# Patient Record
Sex: Female | Born: 1995 | Race: White | Hispanic: No | Marital: Single | Smoking: Never smoker
Health system: Southern US, Community
[De-identification: ages and names within clinical notes are randomized; demographics above are authoritative.]

## PROBLEM LIST (undated history)

## (undated) DIAGNOSIS — J45909 Unspecified asthma, uncomplicated: Secondary | ICD-10-CM

## (undated) DIAGNOSIS — G43909 Migraine, unspecified, not intractable, without status migrainosus: Secondary | ICD-10-CM

## (undated) HISTORY — PX: KIDNEY SURGERY: SHX687

## (undated) HISTORY — PX: LITHOTRIPSY: SUR834

## (undated) HISTORY — PX: TONSILLECTOMY: SUR1361

---
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
## 2017-06-04 ENCOUNTER — Ambulatory Visit
Admission: EM | Admit: 2017-06-04 | Discharge: 2017-06-04 | Disposition: A | Discharge status: Home / Self Care | Payer: Commercial Managed Care - PPO | Attending: Family Medicine | Admitting: Family Medicine

## 2017-06-04 ENCOUNTER — Other Ambulatory Visit: Payer: Self-pay

## 2017-06-04 ENCOUNTER — Encounter: Payer: Self-pay | Admitting: Emergency Medicine

## 2017-06-04 DIAGNOSIS — R221 Localized swelling, mass and lump, neck: Secondary | ICD-10-CM

## 2017-06-04 HISTORY — DX: Migraine, unspecified, not intractable, without status migrainosus: G43.909

## 2017-06-04 NOTE — ED Triage Notes (Signed)
Patient in today c/o right side of neck swelling x 2 months. Patient denies any difficulty swallowing, thyroid problems or sore throat. No fever.

## 2017-06-04 NOTE — Discharge Instructions (Signed)
Follow up with Primary Care Provider for further evaluation with ultrasound

## 2017-06-04 NOTE — ED Provider Notes (Signed)
[facility]    CSN: [id number] Arrival date & time: 06/04/17  1656     History   Chief Complaint Chief Complaint  Patient presents with  . Facial Swelling    neck    HPI Mary Robertson is a 22 y.o. female.   22 yo female with a c/o right sided neck swelling for 2 months. States she happened to notice one day that her right side of the neck appeared larger than the left. Denies any pain, injuries, difficulty swallowing, difficulty breathing, fevers, chills, ear pain, skin rash, or limitation of movement.    The history is provided by the patient.    Past Medical History:  Diagnosis Date  . Migraine     There are no active problems to display for this patient.   Past Surgical History:  Procedure Laterality Date  . KIDNEY SURGERY    . LITHOTRIPSY    . TONSILLECTOMY      OB History    Gravida Para Term Preterm AB Living   0 0 0 0 0 0   SAB TAB Ectopic Multiple Live Births   0 0 0 0         Home Medications    Prior to Admission medications   Medication Sig Start Date End Date Taking? Authorizing Provider  SUMAtriptan (IMITREX) 50 MG tablet Take 1 tablet (50 mg total) by mouth once as needed for migraine. May repeat in 2 hours if headache persists or recurs. 06/14/15 06/04/17 Yes Emily Filbert, MD    Family History Family History  Problem Relation Age of Onset  . Valvular heart disease Mother   . Arrhythmia Mother   . Asthma Mother   . Hyperlipidemia Mother   . Hypertension Father   . Hyperlipidemia Father   . Diabetes Father   . Heart disease Father     Social History Social History   Tobacco Use  . Smoking status: Never Smoker  . Smokeless tobacco: Never Used  Substance Use Topics  . Alcohol use: No  . Drug use: No     Allergies   Septra [sulfamethoxazole-trimethoprim]   Review of Systems Review of Systems   Physical Exam Triage Vital Signs ED Triage Vitals [06/04/17 1708]  Enc Vitals Group     BP 137/65    Pulse Rate 87     Resp 16     Temp 98.5 F (36.9 C)     Temp Source Oral     SpO2 100 %     Weight 210 lb (95.3 kg)     Height 5\' 2"  (1.575 m)     Head Circumference      Peak Flow      Pain Score 0     Pain Loc      Pain Edu?      Excl. in GC?    No data found.  Updated Vital Signs BP 137/65 (BP Location: Left Arm)   Pulse 87   Temp 98.5 F (36.9 C) (Oral)   Resp 16   Ht 5\' 2"  (1.575 m)   Wt 210 lb (95.3 kg)   LMP 05/12/2017 (Approximate)   SpO2 100%   BMI 38.41 kg/m   Visual Acuity Right Eye Distance:   Left Eye Distance:   Bilateral Distance:    Right Eye Near:   Left Eye Near:    Bilateral Near:     Physical Exam  Constitutional: She appears well-developed and well-nourished. No distress.  HENT:  Head: Normocephalic and atraumatic.  Right Ear: External ear normal.  Left Ear: External ear normal.  Nose: Nose normal.  Mouth/Throat: Oropharynx is clear and moist. No oropharyngeal exudate.  Neck: Normal range of motion. Neck supple.    Right anterolateral subcutaneous soft tissue enlargement (compared to left); non-tender; no skin erythema or skin lesions  Lymphadenopathy:    She has no cervical adenopathy.  Skin: She is not diaphoretic.  Nursing note and vitals reviewed.    UC Treatments / Results  Labs (all labs ordered are listed, but only abnormal results are displayed) Labs Reviewed - No data to display  EKG  EKG Interpretation None       Radiology No results found.  Procedures Procedures (including critical care time)  Medications Ordered in UC Medications - No data to display   Initial Impression / Assessment and Plan / UC Course  I have reviewed the triage vital signs and the nursing notes.  Pertinent labs & imaging results that were available during my care of the patient were reviewed by me and considered in my medical decision making (see chart for details).       Final Clinical Impressions(s) / UC Diagnoses    Final diagnoses:  Localized swelling, mass and lump, neck    ED Discharge Orders    None     1.discussed with patient and mother possible etiologies 2. Recommend follow up with PCP for further evaluation referral for possible ultrasound and/or specialist   Controlled Substance Prescriptions [place] Controlled Substance Registry consulted? Not Applicable   Payton Mccallumonty, Ibraham Levi, MD 06/04/17 [phone]

## 2017-06-07 ENCOUNTER — Telehealth: Payer: Self-pay

## 2017-06-07 NOTE — Telephone Encounter (Signed)
[facility] SPoke with pt. Mother, reports that she followed up with primary.  Patient instructed to call back with any questions or concerns. [facility]

## 2017-07-21 ENCOUNTER — Other Ambulatory Visit: Payer: Self-pay | Admitting: Otolaryngology

## 2017-07-21 DIAGNOSIS — R221 Localized swelling, mass and lump, neck: Secondary | ICD-10-CM

## 2017-07-30 ENCOUNTER — Ambulatory Visit
Admission: RE | Admit: 2017-07-30 | Discharge: 2017-07-30 | Disposition: A | Discharge status: Home / Self Care | Payer: Commercial Managed Care - PPO | Source: Ambulatory Visit | Attending: Otolaryngology | Admitting: Otolaryngology

## 2017-07-30 DIAGNOSIS — R221 Localized swelling, mass and lump, neck: Secondary | ICD-10-CM

## 2017-07-30 HISTORY — DX: Unspecified asthma, uncomplicated: J45.909

## 2017-07-30 MED ORDER — IOPAMIDOL (ISOVUE-300) INJECTION 61%
75.0000 mL | Freq: Once | INTRAVENOUS | Status: AC | PRN
  Administered 2017-07-30: 75 mL via INTRAVENOUS

## 2018-03-13 ENCOUNTER — Encounter: Payer: Self-pay | Admitting: Emergency Medicine

## 2018-03-13 ENCOUNTER — Ambulatory Visit
Admission: EM | Admit: 2018-03-13 | Discharge: 2018-03-13 | Disposition: A | Discharge status: Home / Self Care | Payer: Commercial Managed Care - PPO | Attending: Emergency Medicine | Admitting: Emergency Medicine

## 2018-03-13 ENCOUNTER — Other Ambulatory Visit: Payer: Self-pay

## 2018-03-13 DIAGNOSIS — J069 Acute upper respiratory infection, unspecified: Secondary | ICD-10-CM

## 2018-03-13 DIAGNOSIS — J0100 Acute maxillary sinusitis, unspecified: Secondary | ICD-10-CM

## 2018-03-13 MED ORDER — ALBUTEROL SULFATE HFA 108 (90 BASE) MCG/ACT IN AERS: 1.0000 | INHALATION_SPRAY | Freq: Four times a day (QID) | RESPIRATORY_TRACT | 0 refills | Status: DC | PRN

## 2018-03-13 MED ORDER — FLUTICASONE PROPIONATE 50 MCG/ACT NA SUSP: 2.0000 | Freq: Every day | NASAL | 0 refills | Status: DC

## 2018-03-13 MED ORDER — AEROCHAMBER PLUS MISC: 2 refills | Status: DC

## 2018-03-13 MED ORDER — IBUPROFEN 600 MG PO TABS: 600.0000 mg | ORAL_TABLET | Freq: Four times a day (QID) | ORAL | 0 refills | Status: DC | PRN

## 2018-03-13 MED ORDER — AMOXICILLIN-POT CLAVULANATE 875-125 MG PO TABS: 1.0000 | ORAL_TABLET | Freq: Two times a day (BID) | ORAL | 0 refills | Status: DC

## 2018-03-13 MED ORDER — BENZONATATE 200 MG PO CAPS: 200.0000 mg | ORAL_CAPSULE | Freq: Three times a day (TID) | ORAL | 0 refills | Status: DC | PRN

## 2018-03-13 MED ORDER — HYDROCOD POLST-CPM POLST ER 10-8 MG/5ML PO SUER: 5.0000 mL | Freq: Two times a day (BID) | ORAL | 0 refills | Status: DC | PRN

## 2018-03-13 NOTE — ED Provider Notes (Signed)
HPI  SUBJECTIVE:  Mary Robertson is a 22 y.o. female who presents with 6 days of yellow nasal congestion, rhinorrhea, postnasal drip, cough productive of yellowish sputum, shortness of breath, dyspnea on exertion.  She reports upper dental pain but states that this has resolved.  She reports left ear pain.  She reports fevers T-max 100.  No facial swelling, wheezing, chest pain.  No allergy symptoms.  She is sleeping okay at night.  No antibiotics in the past month.  No antipyretic in the past 4 to 6 hours. She tried a leftover single dose of amoxicillin.  She has been alternating Tylenol 500 to 1,000 mg with ibuprofen 400 to 600 mg each day.  She has also tried NyQuil, Alka-Seltzer.  No alleviating factors.  Her symptoms are worse with bending forward.  She has a past medical history of asthma and is not using her inhaler.  No history of allergies, diabetes, hypertension.  LMP: End of October.  PMD: [facility]    Past Medical History:  Diagnosis Date  . Asthma   . Migraine     Past Surgical History:  Procedure Laterality Date  . KIDNEY SURGERY    . LITHOTRIPSY    . TONSILLECTOMY      Family History  Problem Relation Age of Onset  . Valvular heart disease Mother   . Arrhythmia Mother   . Asthma Mother   . Hyperlipidemia Mother   . Hypertension Father   . Hyperlipidemia Father   . Diabetes Father   . Heart disease Father     Social History   Tobacco Use  . Smoking status: Never Smoker  . Smokeless tobacco: Never Used  Substance Use Topics  . Alcohol use: No  . Drug use: No    No current facility-administered medications for this encounter.   Current Outpatient Medications:  .  albuterol (PROVENTIL HFA;VENTOLIN HFA) 108 (90 Base) MCG/ACT inhaler, Inhale 1-2 puffs into the lungs every 6 (six) hours as needed for wheezing or shortness of breath., Disp: 1 Inhaler, Rfl: 0 .  amoxicillin-clavulanate (AUGMENTIN) 875-125 MG tablet, Take 1 tablet by mouth 2 (two) times  daily. X 7 days, Disp: 14 tablet, Rfl: 0 .  benzonatate (TESSALON) 200 MG capsule, Take 1 capsule (200 mg total) by mouth 3 (three) times daily as needed for cough., Disp: 30 capsule, Rfl: 0 .  chlorpheniramine-HYDROcodone (TUSSIONEX PENNKINETIC ER) 10-8 MG/5ML SUER, Take 5 mLs by mouth every 12 (twelve) hours as needed for cough., Disp: 60 mL, Rfl: 0 .  fluticasone (FLONASE) 50 MCG/ACT nasal spray, Place 2 sprays into both nostrils daily., Disp: 16 g, Rfl: 0 .  ibuprofen (ADVIL,MOTRIN) 600 MG tablet, Take 1 tablet (600 mg total) by mouth every 6 (six) hours as needed., Disp: 30 tablet, Rfl: 0 .  Spacer/Aero-Holding Chambers (AEROCHAMBER PLUS) inhaler, Use as instructed, Disp: 1 each, Rfl: 2  Allergies  Allergen Reactions  . Septra [Sulfamethoxazole-Trimethoprim] Rash     ROS  As noted in HPI.   Physical Exam  BP 136/73 (BP Location: Left Arm)   Pulse 91   Temp 97.7 F (36.5 C) (Oral)   Resp 14   Ht 5\' 2"  (1.575 m)   Wt 88.5 kg   LMP 02/27/2018 (Approximate)   SpO2 100%   BMI 35.67 kg/m   Constitutional: Well developed, well nourished, no acute distress Eyes:  EOMI, conjunctiva normal bilaterally HENT: Normocephalic, atraumatic,mucus membranes moist.  TMs normal bilaterally.  Positive maxillary sinus tenderness.  No frontal sinus  tenderness.  Normal turbinates.  Positive mucoid nasal congestion.  Positive cobblestoning and postnasal drip. Respiratory: Normal inspiratory effort good air movement, lungs clear bilaterally. Cardiovascular: Normal rate GI: nondistended skin: No rash, skin intact Musculoskeletal: no deformities Neurologic: Alert & oriented x 3, no focal neuro deficits Psychiatric: Speech and behavior appropriate   ED Course   Medications - No data to display  No orders of the defined types were placed in this encounter.   No results found for this or any previous visit (from the past 24 hour(s)). No results found.  ED Clinical Impression  Viral upper  respiratory tract infection  Acute non-recurrent maxillary sinusitis   ED Assessment/Plan  [place] Narcotic database reviewed for this patient, and feel that the risk/benefit ratio today is favorable for proceeding with a prescription for controlled substance.  No narcotic prescriptions in the past 2 years.  Patient with a URI/sinusitis and possibly an asthma exacerbation.  Doubt pneumonia.  Normal vital signs, 100% on room air, no antipyretic in the past 4 to 6 hours.  Will start Mucinex D, saline nasal irrigation with a Lloyd Huger med rinse and distilled water as often as she wants, Flonase, 2 puffs from an albuterol inhaler using a spacer every 4-6 hours.  She may back off on this as she starts to feel better.  Ibuprofen 600 mg to take with 1 g of Tylenol together 3 or 4 times a day.  Tessalon for the cough during the day, Tussionex for the cough at night.  Wait-and-see prescription of Augmentin.  Advised her to try these medicines first for several days and if not, then go ahead and fill the Augmentin.  Follow-up with PMD in several days as needed.  Discussed MDM, treatment plan, and plan for follow-up with patient and parent.  They agree with plan .  Meds ordered this encounter  Medications  . fluticasone (FLONASE) 50 MCG/ACT nasal spray    Sig: Place 2 sprays into both nostrils daily.    Dispense:  16 g    Refill:  0  . ibuprofen (ADVIL,MOTRIN) 600 MG tablet    Sig: Take 1 tablet (600 mg total) by mouth every 6 (six) hours as needed.    Dispense:  30 tablet    Refill:  0  . amoxicillin-clavulanate (AUGMENTIN) 875-125 MG tablet    Sig: Take 1 tablet by mouth 2 (two) times daily. X 7 days    Dispense:  14 tablet    Refill:  0  . chlorpheniramine-HYDROcodone (TUSSIONEX PENNKINETIC ER) 10-8 MG/5ML SUER    Sig: Take 5 mLs by mouth every 12 (twelve) hours as needed for cough.    Dispense:  60 mL    Refill:  0  . benzonatate (TESSALON) 200 MG capsule    Sig: Take 1 capsule (200 mg total) by mouth  3 (three) times daily as needed for cough.    Dispense:  30 capsule    Refill:  0  . Spacer/Aero-Holding Chambers (AEROCHAMBER PLUS) inhaler    Sig: Use as instructed    Dispense:  1 each    Refill:  2  . albuterol (PROVENTIL HFA;VENTOLIN HFA) 108 (90 Base) MCG/ACT inhaler    Sig: Inhale 1-2 puffs into the lungs every 6 (six) hours as needed for wheezing or shortness of breath.    Dispense:  1 Inhaler    Refill:  0    *This clinic note was created using Scientist, clinical (histocompatibility and immunogenetics). Therefore, there may be occasional mistakes despite careful proofreading.   ?  Domenick Gong, MD 03/13/18 2004

## 2018-03-13 NOTE — ED Triage Notes (Signed)
Patient c/o cough and chest congestion since Saturday.  Patient reports low grade fevers.

## 2018-03-13 NOTE — Discharge Instructions (Addendum)
Discontinue all other cold medicines.  Start maximum strength Mucinex D twice a day and drink plenty of extra water, saline nasal irrigation with a Lloyd Huger med rinse and distilled water as often as you want, Flonase, 2 puffs from your albuterol inhaler using a spacer every 4-6 hours.   back off on this as you start to feel better.  Ibuprofen 600 mg to take with 1 g of Tylenol together 3 or 4 times a day.  Tessalon for the cough during the day, Tussionex for the cough at night.  Wait-and-see prescription of Augmentin.  try these other medicines first for day or 2 and if you are not getting better, go ahead and fill the Augmentin.  Follow-up with your primary care physician in several days as needed.

## 2018-04-19 IMAGING — CT CT NECK W/ CM
5 of 6 series · 14 of 35 positions shown, 16 images · IV contrast (iopamidol)
Comparison: Cervical spine radiographs April 05, 2008

CLINICAL DATA: Nonpainful RIGHT neck mass since March 2017.
History of tonsillectomy.

EXAM:
CT NECK WITH CONTRAST
TECHNIQUE: Multidetector CT imaging of the neck was performed using the
standard protocol following the bolus administration of intravenous
contrast.
CONTRAST:  75mL [id number] IOPAMIDOL (ZRX44S-6HH) INJECTION 61%

[Series 2: axial neck neck (person_name) · axial · 0.59mm/px · z∈[-717,-627]mm · 2 of 137 slices shown, 3 images]
[im 46/137  soft-tissue]
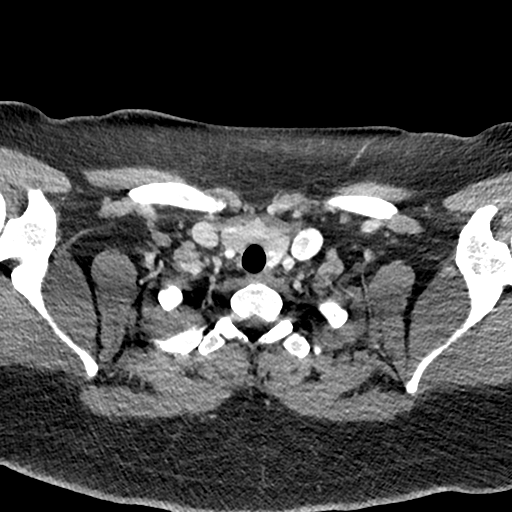
[im 46/137  bone]
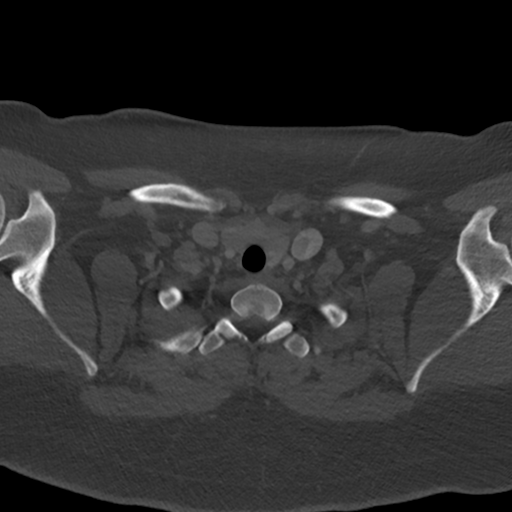
[im 91/137  bone]
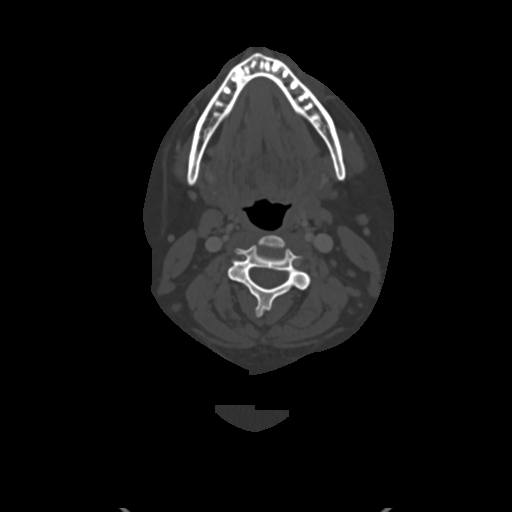

[Series 3: axial bone neck · axial · 0.59mm/px · z∈[-717,-627]mm · 2 of 137 slices shown]
[im 46/137  bone]
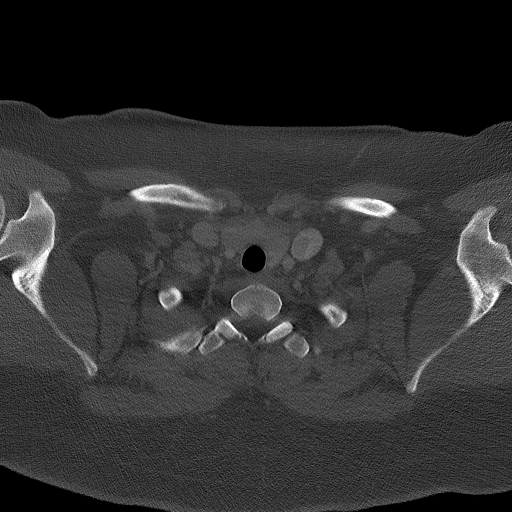
[im 91/137  bone]
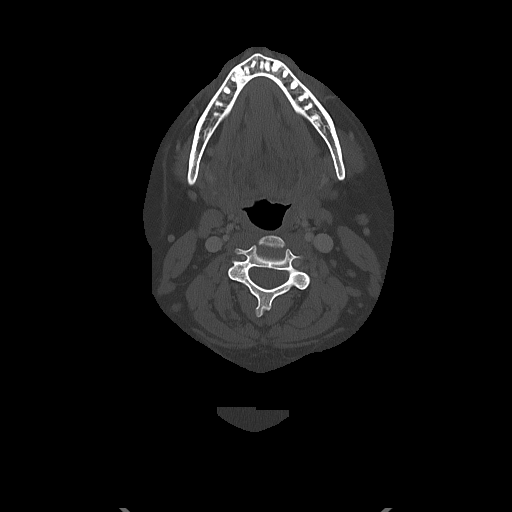

[Series 5: coronal neck neck (person_name) · coronal · 0.54mm/px · 3 of 134 slices shown]
[im 27/134  bone]
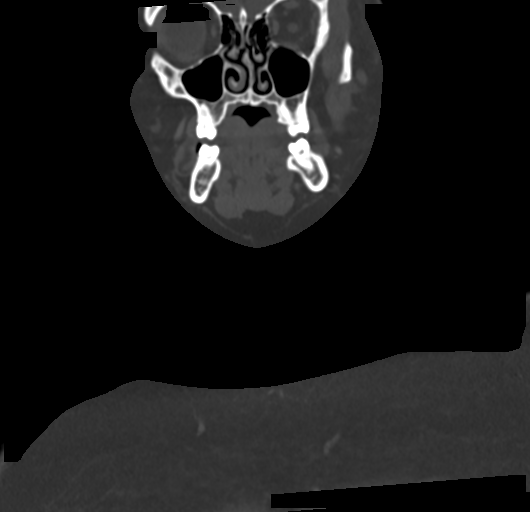
[im 54/134  bone]
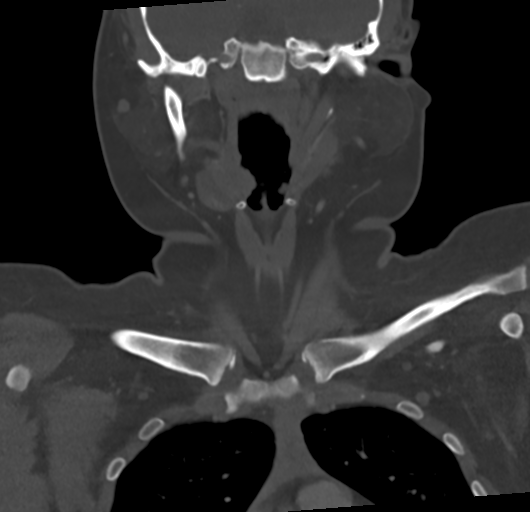
[im 80/134  bone]
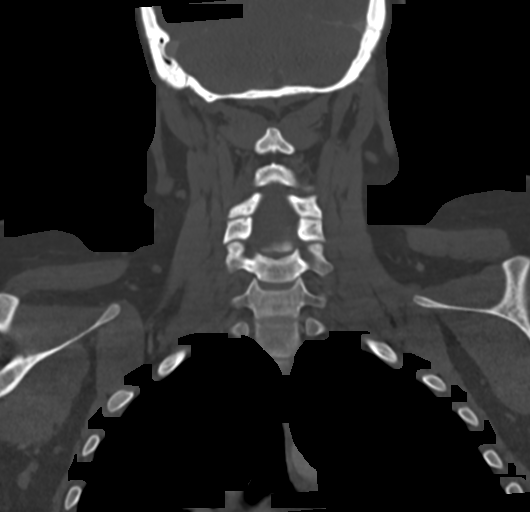

[Series 7: sagittal neck neck (person_name) · sagittal · 0.53mm/px · 5 of 141 slices shown, 6 images]
[im 47/141  bone]
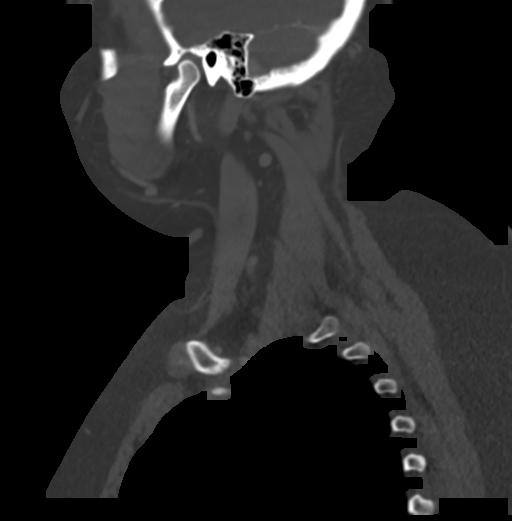
[im 59/141  bone]
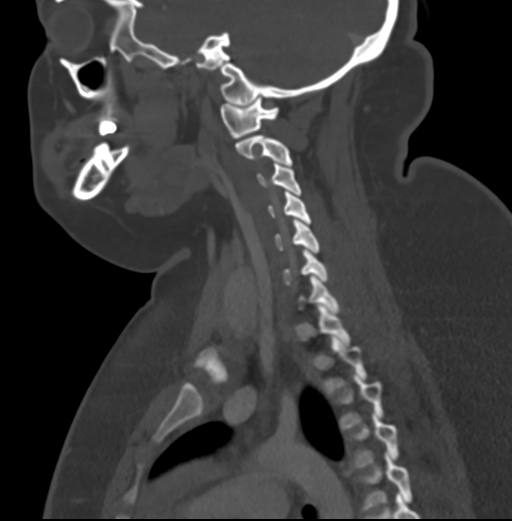
[im 71/141  soft-tissue]
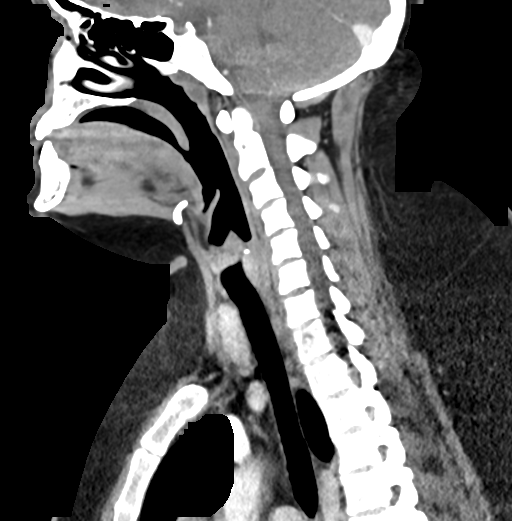
[im 71/141  bone]
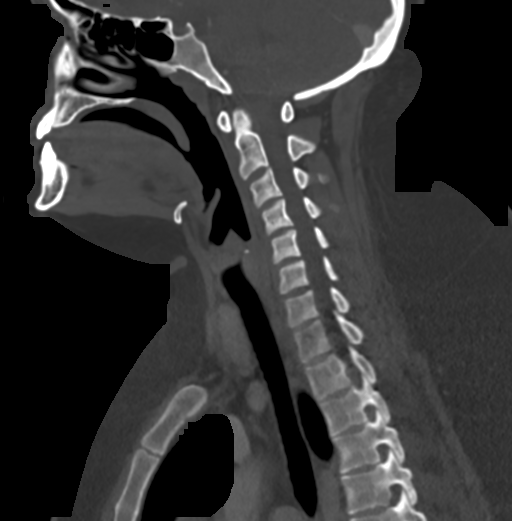
[im 82/141  bone]
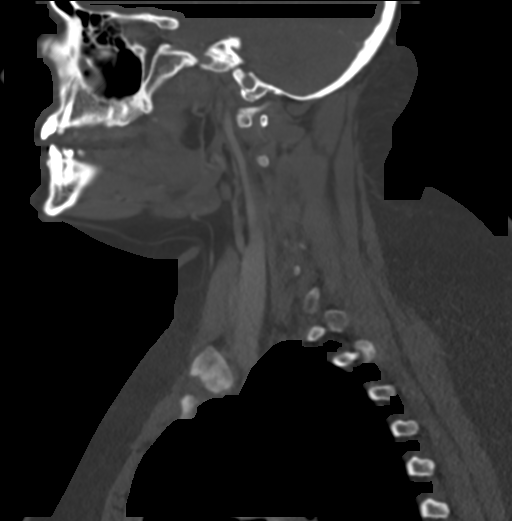
[im 94/141  bone]
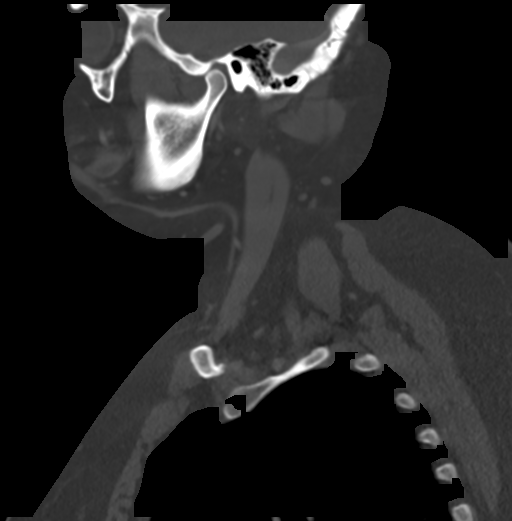

[Series 9: ax oropharynx neck neck (person_name) · axial · 0.59mm/px · z∈[-717,-627]mm · 2 of 137 slices shown]
[im 46/137  bone]
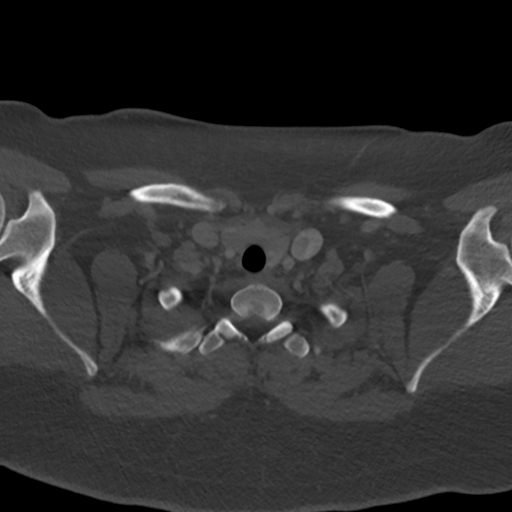
[im 91/137  bone]
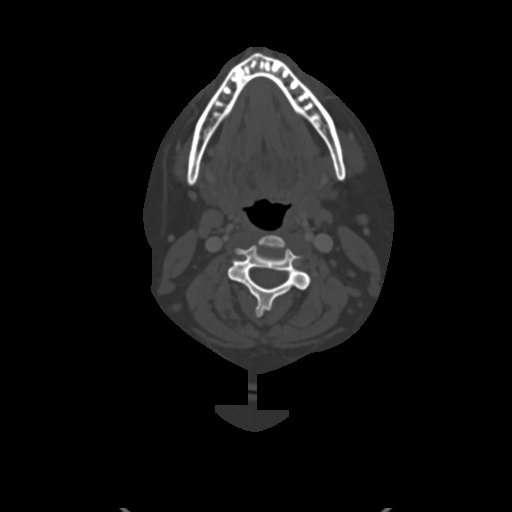

[14 of 35 positions shown; findings below may reference images not displayed]

FINDINGS: PHARYNX AND LARYNX: Normal.  Widely patent airway.

SALIVARY GLANDS: Normal.

THYROID: Normal.

LYMPH NODES: No lymphadenopathy by CT size criteria.

VASCULAR: Normal.

LIMITED INTRACRANIAL: Normal.

VISUALIZED ORBITS: Normal.

MASTOIDS AND VISUALIZED PARANASAL SINUSES: Well-aerated.

SKELETON: Nonacute.  Cervical spine congenital canal narrowing.

UPPER CHEST: Lung apices are clear. No superior mediastinal
lymphadenopathy. Residual normal thymic tissue.

OTHER: Marker overlies RIGHT anterolateral neck (axial 64/137)
without underlying abnormality.
IMPRESSION: RIGHT anterolateral neck marker without underlying abnormality or
acute process in the neck.

## 2022-05-08 ENCOUNTER — Ambulatory Visit
Admission: EM | Admit: 2022-05-08 | Discharge: 2022-05-08 | Disposition: A | Discharge status: Home / Self Care | Payer: Commercial Managed Care - PPO | Attending: Emergency Medicine | Admitting: Emergency Medicine

## 2022-05-08 DIAGNOSIS — J0100 Acute maxillary sinusitis, unspecified: Secondary | ICD-10-CM | POA: Diagnosis not present

## 2022-05-08 MED ORDER — AMOXICILLIN-POT CLAVULANATE 875-125 MG PO TABS: 1.0000 | ORAL_TABLET | Freq: Two times a day (BID) | ORAL | 0 refills | Status: DC

## 2022-05-08 MED ORDER — IPRATROPIUM BROMIDE 0.03 % NA SOLN: 2.0000 | Freq: Two times a day (BID) | NASAL | 12 refills | Status: DC

## 2022-05-08 NOTE — ED Triage Notes (Signed)
Pt c/o nasal congestion x2 days. Pt denies any fevers,chills or bodyaches.

## 2022-05-08 NOTE — ED Provider Notes (Signed)
[facility]    CSN: [id number] Arrival date & time: 05/08/22  1407      History   Chief Complaint Chief Complaint  Patient presents with   Nasal Congestion    HPI Mary Robertson is a 27 y.o. female.   As for evaluation of nasal congestion, rhinorrhea, sinus pain and pressure and sinus headaches occurring for 7 days.  Known sick contacts prior.  Tolerating food and liquids.  Has attempted use of Alka-Seltzer cold and flu and Benadryl which have been ineffective.  History of asthma and migraines.  Not currently taking medications for either.  Denies shortness of breath, wheezing, blurred vision, dizziness, lightheadedness, nausea or vomiting.   Past Medical History:  Diagnosis Date   Asthma    Migraine     There are no problems to display for this patient.   Past Surgical History:  Procedure Laterality Date   KIDNEY SURGERY     LITHOTRIPSY     TONSILLECTOMY      OB History     Gravida  0   Para  0   Term  0   Preterm  0   AB  0   Living  0      SAB  0   IAB  0   Ectopic  0   Multiple  0   Live Births               Home Medications    Prior to Admission medications   Medication Sig Start Date End Date Taking? Authorizing Provider  albuterol (PROVENTIL HFA;VENTOLIN HFA) 108 (90 Base) MCG/ACT inhaler Inhale 1-2 puffs into the lungs every 6 (six) hours as needed for wheezing or shortness of breath. 03/13/18   Melynda Ripple, MD  amoxicillin-clavulanate (AUGMENTIN) 875-125 MG tablet Take 1 tablet by mouth 2 (two) times daily. X 7 days 03/13/18   Melynda Ripple, MD  benzonatate (TESSALON) 200 MG capsule Take 1 capsule (200 mg total) by mouth 3 (three) times daily as needed for cough. 03/13/18   Melynda Ripple, MD  chlorpheniramine-HYDROcodone (TUSSIONEX PENNKINETIC ER) 10-8 MG/5ML SUER Take 5 mLs by mouth every 12 (twelve) hours as needed for cough. 03/13/18   Melynda Ripple, MD  fluticasone (FLONASE) 50 MCG/ACT nasal spray  Place 2 sprays into both nostrils daily. 03/13/18   Melynda Ripple, MD  ibuprofen (ADVIL,MOTRIN) 600 MG tablet Take 1 tablet (600 mg total) by mouth every 6 (six) hours as needed. 03/13/18   Melynda Ripple, MD  Spacer/Aero-Holding Chambers (AEROCHAMBER PLUS) inhaler Use as instructed 03/13/18   Melynda Ripple, MD    Family History Family History  Problem Relation Age of Onset   Valvular heart disease Mother    Arrhythmia Mother    Asthma Mother    Hyperlipidemia Mother    Hypertension Father    Hyperlipidemia Father    Diabetes Father    Heart disease Father     Social History Social History   Tobacco Use   Smoking status: Never   Smokeless tobacco: Never  Vaping Use   Vaping Use: Every day  Substance Use Topics   Alcohol use: No   Drug use: No     Allergies   Cinnamon and Septra [sulfamethoxazole-trimethoprim]   Review of Systems Review of Systems  Constitutional: Negative.   HENT:  Positive for congestion, rhinorrhea, sinus pressure, sinus pain and sore throat. Negative for dental problem, drooling, ear discharge, ear pain, facial swelling, hearing loss, mouth sores, nosebleeds, postnasal drip, sneezing,  tinnitus, trouble swallowing and voice change.   Respiratory: Negative.    Cardiovascular: Negative.   Gastrointestinal: Negative.   Skin: Negative.      Physical Exam Triage Vital Signs ED Triage Vitals  Enc Vitals Group     BP 05/08/22 1510 114/73     Pulse Rate 05/08/22 1510 60     Resp 05/08/22 1510 16     Temp 05/08/22 1510 98.8 F (37.1 C)     Temp Source 05/08/22 1510 Oral     SpO2 05/08/22 1510 99 %     Weight 05/08/22 1509 185 lb (83.9 kg)     Height 05/08/22 1509 5\' 2"  (1.575 m)     Head Circumference --      Peak Flow --      Pain Score 05/08/22 1508 0     Pain Loc --      Pain Edu? --      Excl. in [place]? --    No data found.  Updated Vital Signs BP 114/73 (BP Location: Left Arm)   Pulse 60   Temp 98.8 F (37.1 C) (Oral)    Resp 16   Ht 5\' 2"  (1.575 m)   Wt 185 lb (83.9 kg)   LMP 03/20/2022 (Approximate)   SpO2 99%   BMI 33.84 kg/m   Visual Acuity Right Eye Distance:   Left Eye Distance:   Bilateral Distance:    Right Eye Near:   Left Eye Near:    Bilateral Near:     Physical Exam Constitutional:      Appearance: Normal appearance.  HENT:     Head: Normocephalic.     Right Ear: Tympanic membrane, ear canal and external ear normal.     Left Ear: Tympanic membrane, ear canal and external ear normal.     Nose: Congestion and rhinorrhea present.     Right Turbinates: Swollen.     Left Turbinates: Swollen.     Right Sinus: Maxillary sinus tenderness present. No frontal sinus tenderness.     Left Sinus: Maxillary sinus tenderness present. No frontal sinus tenderness.     Mouth/Throat:     Mouth: Mucous membranes are moist.     Pharynx: Oropharynx is clear.  Eyes:     Extraocular Movements: Extraocular movements intact.  Cardiovascular:     Rate and Rhythm: Normal rate and regular rhythm.     Pulses: Normal pulses.     Heart sounds: Normal heart sounds.  Pulmonary:     Effort: Pulmonary effort is normal.     Breath sounds: Normal breath sounds.  Skin:    General: Skin is warm and dry.  Neurological:     Mental Status: She is alert and oriented to person, place, and time. Mental status is at baseline.  Psychiatric:        Mood and Affect: Mood normal.        Behavior: Behavior normal.      UC Treatments / Results  Labs (all labs ordered are listed, but only abnormal results are displayed) Labs Reviewed - No data to display  EKG   Radiology No results found.  Procedures Procedures (including critical care time)  Medications Ordered in UC Medications - No data to display  Initial Impression / Assessment and Plan / UC Course  I have reviewed the triage vital signs and the nursing notes.  Pertinent labs & imaging results that were available during my care of the patient were  reviewed by me and considered  in my medical decision making (see chart for details).  Acute Nonrecurrent maxillary sinusitis  Vitals are stable patient is no signs of distress nontoxic-appearing, presentation and symptomology is consistent with a sinusitis and as symptoms have been present for 7 days without signs of resolution we will provide bacterial coverage, Augmentin prescribed as well as ipratropium for additional support, may continue use of over-the-counter medications if deemed helpful, may follow-up with his urgent care as needed Final Clinical Impressions(s) / UC Diagnoses   Final diagnoses:  None   Discharge Instructions   None    ED Prescriptions   None    PDMP not reviewed this encounter.   Valinda Hoar, NP 05/08/22 1534

## 2022-05-08 NOTE — Discharge Instructions (Addendum)
Today you are being treated for a sinus infection and as your symptoms have been present for 7 days ends of relief we will provide bacterial coverage  Take Augmentin every morning and every evening for 7 days  Begin use of Atrovent nasal spray every morning and every evening to help further clear sinuses and reduce sinus pressure    You can take Tylenol and/or Ibuprofen as needed for fever reduction and pain relief.   For cough: honey 1/2 to 1 teaspoon (you can dilute the honey in water or another fluid).  You can also use guaifenesin and dextromethorphan for cough. You can use a humidifier for chest congestion and cough.  If you don't have a humidifier, you can sit in the bathroom with the hot shower running.      For sore throat: try warm salt water gargles, cepacol lozenges, throat spray, warm tea or water with lemon/honey, popsicles or ice, or OTC cold relief medicine for throat discomfort.   For congestion: take a daily anti-histamine like Zyrtec, Claritin, and a oral decongestant, such as pseudoephedrine.  You can also use Flonase 1-2 sprays in each nostril daily.   It is important to stay hydrated: drink plenty of fluids (water, gatorade/powerade/pedialyte, juices, or teas) to keep your throat moisturized and help further relieve irritation/discomfort.

## 2022-11-08 ENCOUNTER — Encounter: Payer: Self-pay | Admitting: Nurse Practitioner

## 2022-11-08 ENCOUNTER — Ambulatory Visit: Payer: Commercial Managed Care - PPO | Admitting: Nurse Practitioner

## 2022-11-08 VITALS — BP 110/70 | HR 73 | Temp 99.3°F | Ht 62.0 in | Wt 170.0 lb

## 2022-11-08 DIAGNOSIS — F321 Major depressive disorder, single episode, moderate: Secondary | ICD-10-CM

## 2022-11-08 DIAGNOSIS — K219 Gastro-esophageal reflux disease without esophagitis: Secondary | ICD-10-CM

## 2022-11-08 DIAGNOSIS — M25561 Pain in right knee: Secondary | ICD-10-CM | POA: Diagnosis not present

## 2022-11-08 DIAGNOSIS — F419 Anxiety disorder, unspecified: Secondary | ICD-10-CM | POA: Diagnosis not present

## 2022-11-08 MED ORDER — OMEPRAZOLE 40 MG PO CPDR: 40.0000 mg | DELAYED_RELEASE_CAPSULE | Freq: Every day | ORAL | 3 refills | Status: DC

## 2022-11-08 MED ORDER — FLUOXETINE HCL 20 MG PO CAPS: 20.0000 mg | ORAL_CAPSULE | Freq: Every day | ORAL | 0 refills | Status: DC

## 2022-11-08 NOTE — Progress Notes (Unsigned)
Bethanie Dicker, NP-C Phone: [phone]  Mary Robertson is a 27 y.o. female who presents today to establish care.   Anxiety/Depression- Patient with worsening anxiety and depression symptoms. She has decreased motivation and interest in doing things. She has noticed an increase in irritability and increased mood swings. She has never been on medications for anxiety or depression before. She does not see a counselor or therapist. PHQ- 22 and GAD- 19. Denies SI/HI. Both of her parents are on Prozac and Wellbutrin and are doing well on them. She is wanting to try Prozac. She does not feel that she needs to see a Paediatric nurse.   GERD:   Reflux symptoms: Heartburn, pressure in upper abdomen, reflux, bitter taste   Abd pain: Epigastric   Blood in stool: No  Dysphagia: No   EGD: No  Medication: Tums  Right knee pain- Patient with right knee pain x 1 month after falling. She was evaluated at [facility] at that time and had an xray that was normal. She has continued to have pain. She feels that her knee is going to give out on her, especially when going down stairs, it grinds and pops with movements. She is unable to sit cross legged. She has increased pain with going up the stairs. Denies swelling. Most of her pain is occurring around her knee cap and along the medial side of her knee.   Active Ambulatory Problems    Diagnosis Date Noted   Current moderate episode of major depressive disorder without prior episode (HCC) 11/13/2022   Anxiety 11/13/2022   Gastroesophageal reflux disease 11/13/2022   Acute pain of right knee 11/13/2022   Resolved Ambulatory Problems    Diagnosis Date Noted   No Resolved Ambulatory Problems   Past Medical History:  Diagnosis Date   Asthma    Migraine     Family History  Problem Relation Age of Onset   Valvular heart disease Mother    Arrhythmia Mother    Asthma Mother    Hyperlipidemia Mother    Hypertension Father    Hyperlipidemia  Father    Diabetes Father    Heart disease Father     Social History   Socioeconomic History   Marital status: Single    Spouse name: Not on file   Number of children: Not on file   Years of education: Not on file   Highest education level: Not on file  Occupational History   Not on file  Tobacco Use   Smoking status: Never   Smokeless tobacco: Never  Vaping Use   Vaping Use: Every day  Substance and Sexual Activity   Alcohol use: No   Drug use: No   Sexual activity: Not on file  Other Topics Concern   Not on file  Social History Narrative   Not on file   Social Determinants of Health   Financial Resource Strain: Not on file  Food Insecurity: Not on file  Transportation Needs: Not on file  Physical Activity: Not on file  Stress: Not on file  Social Connections: Not on file  Intimate Partner Violence: Not on file    ROS  General:  Negative for unexplained weight loss, fever Skin: Negative for new or changing mole, sore that won't heal HEENT: Negative for trouble hearing, trouble seeing, ringing in ears, mouth sores, hoarseness, change in voice, dysphagia. CV:  Negative for chest pain, dyspnea, edema, palpitations Resp: Negative for cough, dyspnea, hemoptysis GI: Negative for nausea, vomiting, diarrhea, constipation,  melena, hematochezia. GU: Negative for dysuria, incontinence, urinary hesitance, hematuria, vaginal or penile discharge, polyuria, sexual difficulty, lumps in testicle or breasts MSK: Negative for muscle cramps or aches, swelling Neuro: Negative for headaches, weakness, numbness, dizziness, passing out/fainting Psych: Negative for memory problems  Objective  Physical Exam Vitals:   11/08/22 1009  BP: 110/70  Pulse: 73  Temp: 99.3 F (37.4 C)  SpO2: 99%    BP Readings from Last 3 Encounters:  11/08/22 110/70  05/08/22 114/73  03/13/18 136/73   Wt Readings from Last 3 Encounters:  11/08/22 170 lb (77.1 kg)  05/08/22 185 lb (83.9 kg)   03/13/18 195 lb (88.5 kg)    Physical Exam Constitutional:      General: She is not in acute distress.    Appearance: Normal appearance.  HENT:     Head: Normocephalic.  Cardiovascular:     Rate and Rhythm: Normal rate and regular rhythm.     Heart sounds: Normal heart sounds.  Pulmonary:     Effort: Pulmonary effort is normal.     Breath sounds: Normal breath sounds.  Musculoskeletal:     Right knee: Normal.     Left knee: Crepitus present. No swelling. Decreased range of motion. Tenderness present over the medial joint line.  Skin:    General: Skin is warm and dry.  Neurological:     General: No focal deficit present.     Mental Status: She is alert.  Psychiatric:        Mood and Affect: Mood is anxious.        Behavior: Behavior normal.    Assessment/Plan:   Current moderate episode of major depressive disorder without prior episode (HCC) Assessment & Plan: PHQ- 22 today. Denies SI/HI. Will start patient on Prozac 20 mg daily. Counseled patient on common side effects. Encouraged to contact if worsening symptoms, unusual behavior changes or suicidal thoughts occur. She will follow up in 4 weeks. Will continue to monitor.   Orders: -     FLUoxetine HCl; Take 1 capsule (20 mg total) by mouth daily.  Dispense: 90 capsule; Refill: 0  Anxiety Assessment & Plan: GAD- 19 today. Will start patient on Prozac 20 mg daily. Counseled patient on common side effects. Encouraged to contact if worsening symptoms, unusual behavior changes or suicidal thoughts occur. She will follow up in 4 weeks. Will continue to monitor.   Orders: -     FLUoxetine HCl; Take 1 capsule (20 mg total) by mouth daily.  Dispense: 90 capsule; Refill: 0  Gastroesophageal reflux disease, unspecified whether esophagitis present Assessment & Plan: Worsening acid reflux. Not on any medications. Will start patient on Omeprazole 40 mg daily. Encouraged to monitor diet for triggers, such as decreasing alcohol and  caffeine intake and avoiding spicy and acidic foods. Dietary information provided to patient. She will contact if her symptoms are worsening or changing. Will continue to monitor.   Orders: -     Omeprazole; Take 1 capsule (40 mg total) by mouth daily.  Dispense: 90 capsule; Refill: 3  Acute pain of right knee Assessment & Plan: Symptoms x 1 month. Worsening pain, crepitus and decreased range of motion. Had normal x-ray with [facility]. Will refer to Ortho for further evaluation. Encouraged to rest, ice and elevate. She can take Tylenol as needed for pain.   Orders: -     Ambulatory referral to Orthopedics   Return in about 4 weeks (around 12/06/2022).   Bethanie Dicker, NP-C [facility] - [employer]

## 2022-11-13 ENCOUNTER — Encounter: Payer: Self-pay | Admitting: Nurse Practitioner

## 2022-11-13 DIAGNOSIS — F419 Anxiety disorder, unspecified: Secondary | ICD-10-CM | POA: Insufficient documentation

## 2022-11-13 DIAGNOSIS — K219 Gastro-esophageal reflux disease without esophagitis: Secondary | ICD-10-CM | POA: Insufficient documentation

## 2022-11-13 DIAGNOSIS — F321 Major depressive disorder, single episode, moderate: Secondary | ICD-10-CM | POA: Insufficient documentation

## 2022-11-13 DIAGNOSIS — M25561 Pain in right knee: Secondary | ICD-10-CM | POA: Insufficient documentation

## 2022-11-13 NOTE — Assessment & Plan Note (Signed)
Worsening acid reflux. Not on any medications. Will start patient on Omeprazole 40 mg daily. Encouraged to monitor diet for triggers, such as decreasing alcohol and caffeine intake and avoiding spicy and acidic foods. Dietary information provided to patient. She will contact if her symptoms are worsening or changing. Will continue to monitor.

## 2022-11-13 NOTE — Assessment & Plan Note (Signed)
Symptoms x 1 month. Worsening pain, crepitus and decreased range of motion. Had normal x-ray with [facility]. Will refer to Ortho for further evaluation. Encouraged to rest, ice and elevate. She can take Tylenol as needed for pain.

## 2022-11-13 NOTE — Assessment & Plan Note (Signed)
PHQ- 22 today. Denies SI/HI. Will start patient on Prozac 20 mg daily. Counseled patient on common side effects. Encouraged to contact if worsening symptoms, unusual behavior changes or suicidal thoughts occur. She will follow up in 4 weeks. Will continue to monitor.

## 2022-11-13 NOTE — Assessment & Plan Note (Signed)
GAD- 19 today. Will start patient on Prozac 20 mg daily. Counseled patient on common side effects. Encouraged to contact if worsening symptoms, unusual behavior changes or suicidal thoughts occur. She will follow up in 4 weeks. Will continue to monitor.

## 2022-12-05 NOTE — Progress Notes (Signed)
Bethanie Dicker, NP-C Phone: [phone]  Mary Robertson is a 27 y.o. female who presents today for follow up.   Anxiety/Depression- Patient started on Prozac 20 mg daily on 11/08/2022. She feels that the medication has helped with her depression symptoms. She does not feel as irritable. She continues to have increased anxiety. She would like to stay on the Prozac daily but is interested in trying another medication to help with her anxiety. PHQ- 10 and GAD- 13 today improved from PHQ- 22 and GAD- 19 at last visit. Denies SI/HI.   Allergies- She has increased drainage, sneezing and itchy eyes. She does have an occasional sore throat and congestion. She has tried Claritin and Zyrtec without relief of symptoms. Her symptoms are intermittent and wax and wane in severity. She denies fevers. Denies shortness of breath.   Social History   Tobacco Use  Smoking Status Never  Smokeless Tobacco Never    Current Outpatient Medications on File Prior to Visit  Medication Sig Dispense Refill   FLUoxetine (PROZAC) 20 MG capsule Take 1 capsule (20 mg total) by mouth daily. 90 capsule 0   omeprazole (PRILOSEC) 40 MG capsule Take 1 capsule (40 mg total) by mouth daily. 90 capsule 3   No current facility-administered medications on file prior to visit.    ROS see history of present illness  Objective  Physical Exam Vitals:   12/06/22 0803  BP: 100/60  Pulse: 69  Temp: 98.3 F (36.8 C)  SpO2: 97%    BP Readings from Last 3 Encounters:  12/06/22 100/60  11/08/22 110/70  05/08/22 114/73   Wt Readings from Last 3 Encounters:  12/06/22 166 lb (75.3 kg)  11/08/22 170 lb (77.1 kg)  05/08/22 185 lb (83.9 kg)    Physical Exam Constitutional:      General: She is not in acute distress.    Appearance: Normal appearance.  HENT:     Head: Normocephalic.  Cardiovascular:     Rate and Rhythm: Normal rate and regular rhythm.     Heart sounds: Normal heart sounds.  Pulmonary:     Effort:  Pulmonary effort is normal.     Breath sounds: Normal breath sounds.  Skin:    General: Skin is warm and dry.  Neurological:     General: No focal deficit present.     Mental Status: She is alert.  Psychiatric:        Mood and Affect: Mood normal.        Behavior: Behavior normal.    Assessment/Plan: Please see individual problem list.  Current moderate episode of major depressive disorder without prior episode [facility]) Assessment & Plan: Improvement in symptoms since starting on Prozac 20 mg daily. Continue. PHQ- 10 today. Denies SI/HI. Encouraged to contact if worsening symptoms, unusual behavior changes or suicidal thoughts occur. Will continue to monitor.    Anxiety Assessment & Plan: Mild improvement since starting on Prozac 20 mg daily, continues to have increased anxiety. GAD- 13 today. Will add Buspar 7.5 mg BID, she will continue her Prozac. Counseled patient on common side effects. She will follow up in 4 weeks, sooner if needed. Will continue to monitor.   Orders: -     busPIRone HCl; Take 1 tablet (7.5 mg total) by mouth 2 (two) times daily.  Dispense: 180 tablet; Refill: 0  Allergic rhinitis, unspecified seasonality, unspecified trigger Assessment & Plan: Chronic issue. She has tried Claritin and Zyrtec prior. Will trial Xyzal 5 mg daily and Nasonex nasal spray  daily and see if she gets relief of symptoms. She will contact if her symptoms are changing or worsening. Will continue to monitor.   Orders: -     Levocetirizine Dihydrochloride; Take 1 tablet (5 mg total) by mouth every evening.  Dispense: 90 tablet; Refill: 3 -     Mometasone Furoate; Place 2 sprays into the nose daily.  Dispense: 1 each; Refill: 12   Return in about 4 weeks (around 01/03/2023) for Follow up.   Bethanie Dicker, NP-C [facility] - [employer]

## 2022-12-06 ENCOUNTER — Ambulatory Visit (INDEPENDENT_AMBULATORY_CARE_PROVIDER_SITE_OTHER): Payer: Commercial Managed Care - PPO | Admitting: Nurse Practitioner

## 2022-12-06 ENCOUNTER — Encounter: Payer: Self-pay | Admitting: Nurse Practitioner

## 2022-12-06 VITALS — BP 100/60 | HR 69 | Temp 98.3°F | Ht 62.0 in | Wt 166.0 lb

## 2022-12-06 DIAGNOSIS — F321 Major depressive disorder, single episode, moderate: Secondary | ICD-10-CM

## 2022-12-06 DIAGNOSIS — J309 Allergic rhinitis, unspecified: Secondary | ICD-10-CM

## 2022-12-06 DIAGNOSIS — F419 Anxiety disorder, unspecified: Secondary | ICD-10-CM

## 2022-12-06 MED ORDER — LEVOCETIRIZINE DIHYDROCHLORIDE 5 MG PO TABS: 5.0000 mg | ORAL_TABLET | Freq: Every evening | ORAL | 3 refills | Status: AC

## 2022-12-06 MED ORDER — BUSPIRONE HCL 7.5 MG PO TABS: 7.5000 mg | ORAL_TABLET | Freq: Two times a day (BID) | ORAL | 0 refills | Status: DC

## 2022-12-06 MED ORDER — MOMETASONE FUROATE 50 MCG/ACT NA SUSP: 2.0000 | Freq: Every day | NASAL | 12 refills | Status: AC

## 2022-12-06 NOTE — Assessment & Plan Note (Signed)
Improvement in symptoms since starting on Prozac 20 mg daily. Continue. PHQ- 10 today. Denies SI/HI. Encouraged to contact if worsening symptoms, unusual behavior changes or suicidal thoughts occur. Will continue to monitor.

## 2022-12-06 NOTE — Assessment & Plan Note (Signed)
Chronic issue. She has tried Claritin and Zyrtec prior. Will trial Xyzal 5 mg daily and Nasonex nasal spray daily and see if she gets relief of symptoms. She will contact if her symptoms are changing or worsening. Will continue to monitor.

## 2022-12-06 NOTE — Assessment & Plan Note (Signed)
Mild improvement since starting on Prozac 20 mg daily, continues to have increased anxiety. GAD- 13 today. Will add Buspar 7.5 mg BID, she will continue her Prozac. Counseled patient on common side effects. She will follow up in 4 weeks, sooner if needed. Will continue to monitor.

## 2023-01-03 ENCOUNTER — Ambulatory Visit: Payer: Commercial Managed Care - PPO | Admitting: Nurse Practitioner

## 2023-01-08 ENCOUNTER — Encounter: Payer: Self-pay | Admitting: Nurse Practitioner

## 2023-01-08 ENCOUNTER — Ambulatory Visit: Payer: Commercial Managed Care - PPO | Admitting: Nurse Practitioner

## 2023-01-08 DIAGNOSIS — F419 Anxiety disorder, unspecified: Secondary | ICD-10-CM | POA: Diagnosis not present

## 2023-01-08 DIAGNOSIS — F321 Major depressive disorder, single episode, moderate: Secondary | ICD-10-CM | POA: Diagnosis not present

## 2023-01-08 MED ORDER — FLUOXETINE HCL 20 MG PO CAPS: 20.0000 mg | ORAL_CAPSULE | Freq: Every day | ORAL | 1 refills | Status: DC

## 2023-01-08 MED ORDER — BUSPIRONE HCL 7.5 MG PO TABS
7.5000 mg | ORAL_TABLET | Freq: Two times a day (BID) | ORAL | 1 refills | Status: DC
Start: 2023-01-08 — End: 2023-07-08

## 2023-01-08 NOTE — Assessment & Plan Note (Signed)
Significant improvement in symptoms since starting Buspar 7.5 mg BID. She does not feel that she needs to increase her dose at this time. Refills sent. She will continue Prozac 20 mg daily. GAD- 8 today. Advised to contact if worsening or changing symptoms. Will continue to monitor.

## 2023-01-08 NOTE — Progress Notes (Signed)
Bethanie Dicker, NP-C Phone: [phone]  Mary Robertson is a 27 y.o. female who presents today for follow up.   Anxiety/Depression- Patient started on Buspar 7.5 mg BID on 12/06/2022. She has continued to take her Prozac 20 mg daily. She notes improvement in her symptoms since starting on Buspar. She is feeling a lot better, she does not wake up feeling anxious. She notes that she has more interest in doing things. She would like to continue both medications at their current doses, she feels that her symptoms are well managed at this time. PHQ- 9 and GAD- 8 today. Denies SI/HI.   Social History   Tobacco Use  Smoking Status Never  Smokeless Tobacco Never    Current Outpatient Medications on File Prior to Visit  Medication Sig Dispense Refill   levocetirizine (XYZAL) 5 MG tablet Take 1 tablet (5 mg total) by mouth every evening. 90 tablet 3   mometasone (NASONEX) 50 MCG/ACT nasal spray Place 2 sprays into the nose daily. 1 each 12   omeprazole (PRILOSEC) 40 MG capsule Take 1 capsule (40 mg total) by mouth daily. 90 capsule 3   No current facility-administered medications on file prior to visit.    ROS see history of present illness  Objective  Physical Exam Vitals:   01/08/23 1457  BP: 108/60  Pulse: 69  Temp: 99.1 F (37.3 C)  SpO2: 98%    BP Readings from Last 3 Encounters:  01/08/23 108/60  12/06/22 100/60  11/08/22 110/70   Wt Readings from Last 3 Encounters:  01/08/23 170 lb 6.4 oz (77.3 kg)  12/06/22 166 lb (75.3 kg)  11/08/22 170 lb (77.1 kg)    Physical Exam Constitutional:      General: She is not in acute distress.    Appearance: Normal appearance.  HENT:     Head: Normocephalic.  Cardiovascular:     Rate and Rhythm: Normal rate and regular rhythm.     Heart sounds: Normal heart sounds.  Pulmonary:     Effort: Pulmonary effort is normal.     Breath sounds: Normal breath sounds.  Skin:    General: Skin is warm and dry.  Neurological:      General: No focal deficit present.     Mental Status: She is alert.  Psychiatric:        Mood and Affect: Mood normal.        Behavior: Behavior normal.     Assessment/Plan: Please see individual problem list.  Anxiety Assessment & Plan: Significant improvement in symptoms since starting Buspar 7.5 mg BID. She does not feel that she needs to increase her dose at this time. Refills sent. She will continue Prozac 20 mg daily. GAD- 8 today. Advised to contact if worsening or changing symptoms. Will continue to monitor.   Orders: -     FLUoxetine HCl; Take 1 capsule (20 mg total) by mouth daily.  Dispense: 90 capsule; Refill: 1 -     busPIRone HCl; Take 1 tablet (7.5 mg total) by mouth 2 (two) times daily.  Dispense: 180 tablet; Refill: 1  Current moderate episode of major depressive disorder without prior episode [facility]) Assessment & Plan: Improvement in symptoms since starting on Prozac 20 mg daily. Symptoms well managed at this time. Continue. Refills sent. PHQ- 9 today. Denies SI/HI. Encouraged to contact if worsening symptoms, unusual behavior changes or suicidal thoughts occur. Will continue to monitor.   Orders: -     FLUoxetine HCl; Take 1 capsule (20 mg  total) by mouth daily.  Dispense: 90 capsule; Refill: 1   Return in about 6 months (around 07/08/2023) for Annual Exam.   Bethanie Dicker, NP-C [facility] - [employer]

## 2023-01-08 NOTE — Assessment & Plan Note (Addendum)
Improvement in symptoms since starting on Prozac 20 mg daily. Symptoms well managed at this time. Continue. Refills sent. PHQ- 9 today. Denies SI/HI. Encouraged to contact if worsening symptoms, unusual behavior changes or suicidal thoughts occur. Will continue to monitor.

## 2023-02-21 ENCOUNTER — Ambulatory Visit: Payer: Commercial Managed Care - PPO | Admitting: Nurse Practitioner

## 2023-07-08 ENCOUNTER — Ambulatory Visit (INDEPENDENT_AMBULATORY_CARE_PROVIDER_SITE_OTHER): Payer: Commercial Managed Care - PPO | Admitting: Nurse Practitioner

## 2023-07-08 ENCOUNTER — Other Ambulatory Visit (HOSPITAL_COMMUNITY)
Admission: RE | Admit: 2023-07-08 | Discharge: 2023-07-08 | Disposition: A | Discharge status: Home / Self Care | Source: Ambulatory Visit | Attending: Nurse Practitioner | Admitting: Nurse Practitioner

## 2023-07-08 VITALS — BP 106/62 | HR 79 | Temp 98.2°F | Ht 62.0 in | Wt 187.6 lb

## 2023-07-08 DIAGNOSIS — F321 Major depressive disorder, single episode, moderate: Secondary | ICD-10-CM | POA: Diagnosis not present

## 2023-07-08 DIAGNOSIS — Z01419 Encounter for gynecological examination (general) (routine) without abnormal findings: Secondary | ICD-10-CM | POA: Insufficient documentation

## 2023-07-08 DIAGNOSIS — K219 Gastro-esophageal reflux disease without esophagitis: Secondary | ICD-10-CM

## 2023-07-08 DIAGNOSIS — Z1329 Encounter for screening for other suspected endocrine disorder: Secondary | ICD-10-CM

## 2023-07-08 DIAGNOSIS — G43009 Migraine without aura, not intractable, without status migrainosus: Secondary | ICD-10-CM

## 2023-07-08 DIAGNOSIS — E669 Obesity, unspecified: Secondary | ICD-10-CM

## 2023-07-08 DIAGNOSIS — Z124 Encounter for screening for malignant neoplasm of cervix: Secondary | ICD-10-CM | POA: Insufficient documentation

## 2023-07-08 DIAGNOSIS — Z13220 Encounter for screening for lipoid disorders: Secondary | ICD-10-CM | POA: Diagnosis not present

## 2023-07-08 DIAGNOSIS — L309 Dermatitis, unspecified: Secondary | ICD-10-CM

## 2023-07-08 DIAGNOSIS — F419 Anxiety disorder, unspecified: Secondary | ICD-10-CM

## 2023-07-08 DIAGNOSIS — Z0001 Encounter for general adult medical examination with abnormal findings: Secondary | ICD-10-CM

## 2023-07-08 LAB — HEMOGLOBIN A1C: Hgb A1c MFr Bld: 5.6 % (ref 4.6–6.5)

## 2023-07-08 MED ORDER — OMEPRAZOLE 40 MG PO CPDR: 40.0000 mg | DELAYED_RELEASE_CAPSULE | Freq: Every day | ORAL | 3 refills | Status: AC

## 2023-07-08 MED ORDER — FLUOXETINE HCL 20 MG PO CAPS: 20.0000 mg | ORAL_CAPSULE | Freq: Every day | ORAL | 3 refills | Status: AC

## 2023-07-08 MED ORDER — BUSPIRONE HCL 7.5 MG PO TABS: 7.5000 mg | ORAL_TABLET | Freq: Two times a day (BID) | ORAL | 3 refills | Status: AC

## 2023-07-08 MED ORDER — UBRELVY 100 MG PO TABS
ORAL_TABLET | ORAL | 2 refills | Status: DC
Start: 2023-07-08 — End: 2023-07-14

## 2023-07-08 NOTE — Progress Notes (Unsigned)
 Mary Dicker, NP-C Phone: [phone]  Mary Robertson is a 28 y.o. female who presents today for annual exam.   Discussed the use of AI scribe software for clinical note transcription with the patient, who gave verbal consent to proceed.  History of Present Illness   Mary Robertson is a 28 year old female who presents for a physical exam and evaluation of recurrent migraines.  She has a history of migraines that were previously severe but had improved. Recently, the migraines have returned, described as 'really bad,' occurring once a month and lasting for about a week. They are localized to the left side of her head, with associated symptoms of photophobia, phonophobia, osmophobia, and nausea. The migraines are becoming more frequent and harder to manage with acetaminophen, which she takes four times a day during episodes. She previously saw a headache specialist and received injections, but she prefers not to pursue that treatment again. She was also on topiramate in the past, which she found effective but not her preferred medication. She has not used abortive medications at the onset of migraines before.  The migraines significantly impact her daily life, including difficulty working due to light sensitivity. She mentions that the migraines sometimes occur around her menstrual cycle but are otherwise random. She has a history of a severe migraine in 2017 that resulted in temporary vision loss on the left side, prompting a visit to a neurologist.  Her current medications include fluoxetine and buspirone, which she takes daily and reports doing well on them. She also uses acetaminophen for migraine relief but is concerned about the frequency of use.  In terms of social history, she smokes a vape with a cartridge lasting her approximately two weeks, drinks alcohol minimally, and uses marijuana. 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 She drinks a lot of water and tries to avoid fried foods and excessive carbohydrates.  Family history is significant for colon cancer in both grandfathers. Her mother had a non-cancerous mass. No family history of breast or ovarian cancer.  No chest pain, shortness of breath, abdominal pain, constipation, diarrhea, dysuria, abnormal discharge, dyspareunia, hot flashes, breast concerns, vision changes, dizziness, dysphagia, and skin changes except for eczema on her hands. She reports regular periods with heavy bleeding and severe cramps. She has never had a pap smear before. Her tetanus vaccine is up to date. She declines flu and COVID vaccines.      Social History   Tobacco Use  Smoking Status Never  Smokeless Tobacco Never    Current Outpatient Medications on File Prior to Visit  Medication Sig Dispense Refill   levocetirizine (XYZAL) 5 MG tablet Take 1 tablet (5 mg total) by mouth every evening. 90 tablet 3   mometasone (NASONEX) 50 MCG/ACT nasal spray Place 2 sprays into the nose daily. 1 each 12   No current facility-administered medications on file prior to visit.    ROS see history of present illness  Objective  Physical Exam Vitals:   07/08/23 1429  BP: 106/62  Pulse: 79  Temp: 98.2 F (36.8 C)  SpO2: 100%    BP Readings from Last 3 Encounters:  07/08/23 106/62  01/08/23 108/60  12/06/22 100/60   Wt Readings from Last 3 Encounters:  07/08/23 187 lb 9.6 oz (85.1 kg)  01/08/23 170 lb 6.4 oz (77.3 kg)  12/06/22 166 lb (  75.3 kg)    Physical Exam Exam conducted with a chaperone present Donavan Foil, CMA).  Constitutional:      General: She is not in acute distress.    Appearance: Normal appearance.  HENT:     Head: Normocephalic.     Right Ear: Tympanic membrane normal.     Left Ear: Tympanic membrane normal.     Nose: Nose normal.     Mouth/Throat:     Mouth: Mucous  membranes are moist.     Pharynx: Oropharynx is clear.  Eyes:     Conjunctiva/sclera: Conjunctivae normal.     Pupils: Pupils are equal, round, and reactive to light.  Neck:     Thyroid: No thyromegaly.  Cardiovascular:     Rate and Rhythm: Normal rate and regular rhythm.     Heart sounds: Normal heart sounds.  Pulmonary:     Effort: Pulmonary effort is normal.     Breath sounds: Normal breath sounds.  Abdominal:     General: Abdomen is flat. Bowel sounds are normal.     Palpations: Abdomen is soft. There is no mass.     Tenderness: There is no abdominal tenderness.  Genitourinary:    General: Normal vulva.     Pubic Area: No rash.      Labia:        Right: No lesion.        Left: No lesion.      Vagina: Normal. No vaginal discharge.     Cervix: Normal. No lesion or cervical bleeding.     Uterus: Normal.   Musculoskeletal:        General: Normal range of motion.  Lymphadenopathy:     Cervical: No cervical adenopathy.  Skin:    General: Skin is warm and dry.     Findings: Rash (bilateral hands, consistent with eczema, see pic below) present.  Neurological:     General: No focal deficit present.     Mental Status: She is alert.  Psychiatric:        Mood and Affect: Mood normal.        Behavior: Behavior normal.      Assessment/Plan: Please see individual problem list.  Well woman exam with routine gynecological exam Assessment & Plan: Physical exam complete. We will order routine lab work as outlined. She is due for her first Pap smear. She is a smoker and occasional marijuana user. The impact of caffeine on migraines and cardiovascular health was discussed, along with the importance of regular screenings and vaccinations. Perform a Pap smear today. Advise reduction of caffeine intake, particularly Red Bulls, and encourage smoking cessation. Tetanus vaccine is up to date. Continue routine dental and eye exams. Encourage healthy diet and regular exercise. Return to care in  one year, sooner as needed.   Orders: -     CBC with Differential/Platelet -     Comprehensive metabolic panel  Migraine without aura and without status migrainosus, not intractable Assessment & Plan: She experiences recurrent migraines with increased frequency, inadequately managed with acetaminophen. No clear triggers are identified, though she may be menstrual-related. She prefers to avoid injections. New abortive medications were discussed, and she was advised to monitor migraine frequency for potential preventative treatment. Prescribe abortive medication for use at migraine onset. She should maintain a headache journal to identify potential triggers and report increased frequency for possible preventative treatment. Encourage increased hydration and reduction of caffeine intake, particularly Red Bulls. Return precautions given to patient. If increasing in  frequency, worsening or changing in presentation will refer back to Neurology.   Orders: -     VITAMIN D 25 Hydroxy (Vit-D Deficiency, Fractures) -     Vitamin B12 -     IBC + Ferritin -     Bernita Raisin; Take 1 tablet by mouth at onset of headache. May repeat dose x 1 after 2 hours if needed. Max 200 mg/ 24 hours.  Dispense: 16 tablet; Refill: 2  Eczema, unspecified type Assessment & Plan: Noted on bilateral hands. Worse during winter months and with frequent hand washing. Improvement noted with moisturizers and over the counter ointments. Continue. Advised to contact if worsening or persisting.    Obesity (BMI 30-39.9) -     Hemoglobin A1c  Anxiety -     busPIRone HCl; Take 1 tablet (7.5 mg total) by mouth 2 (two) times daily.  Dispense: 180 tablet; Refill: 3 -     FLUoxetine HCl; Take 1 capsule (20 mg total) by mouth daily.  Dispense: 90 capsule; Refill: 3  Current moderate episode of major depressive disorder without prior episode (HCC) -     FLUoxetine HCl; Take 1 capsule (20 mg total) by mouth daily.  Dispense: 90 capsule;  Refill: 3  Gastroesophageal reflux disease, unspecified whether esophagitis present -     Omeprazole; Take 1 capsule (40 mg total) by mouth daily.  Dispense: 90 capsule; Refill: 3  Screening for cervical cancer -     Cytology - PAP  Lipid screening -     Lipid panel  Thyroid disorder screen -     TSH    Return in about 1 year (around 07/07/2024) for Annual Exam, sooner as needed.   Mary Dicker, NP-C [facility]

## 2023-07-09 ENCOUNTER — Encounter: Payer: Self-pay | Admitting: Nurse Practitioner

## 2023-07-09 DIAGNOSIS — L309 Dermatitis, unspecified: Secondary | ICD-10-CM | POA: Insufficient documentation

## 2023-07-09 LAB — CBC WITH DIFFERENTIAL/PLATELET
Basophils Absolute: 0.1 10*3/uL (ref 0.0–0.1)
Basophils Relative: 0.9 % (ref 0.0–3.0)
Eosinophils Absolute: 0.3 10*3/uL (ref 0.0–0.7)
Eosinophils Relative: 2.4 % (ref 0.0–5.0)
HCT: 42.9 % (ref 36.0–46.0)
Hemoglobin: 14.2 g/dL (ref 12.0–15.0)
Lymphocytes Relative: 29.9 % (ref 12.0–46.0)
Lymphs Abs: 3.8 10*3/uL (ref 0.7–4.0)
MCHC: 33 g/dL (ref 30.0–36.0)
MCV: 87.3 fl (ref 78.0–100.0)
Monocytes Absolute: 0.8 10*3/uL (ref 0.1–1.0)
Monocytes Relative: 6 % (ref 3.0–12.0)
Neutro Abs: 7.7 10*3/uL (ref 1.4–7.7)
Neutrophils Relative %: 60.8 % (ref 43.0–77.0)
Platelets: 318 10*3/uL (ref 150.0–400.0)
RBC: 4.92 Mil/uL (ref 3.87–5.11)
RDW: 14 % (ref 11.5–15.5)
WBC: 12.6 10*3/uL — ABNORMAL HIGH (ref 4.0–10.5)

## 2023-07-09 LAB — TSH: TSH: 1.04 u[IU]/mL (ref 0.35–5.50)

## 2023-07-09 LAB — COMPREHENSIVE METABOLIC PANEL
ALT: 26 U/L (ref 0–35)
AST: 25 U/L (ref 0–37)
Albumin: 4.5 g/dL (ref 3.5–5.2)
Alkaline Phosphatase: 50 U/L (ref 39–117)
BUN: 12 mg/dL (ref 6–23)
CO2: 29 meq/L (ref 19–32)
Calcium: 9.8 mg/dL (ref 8.4–10.5)
Chloride: 104 meq/L (ref 96–112)
Creatinine, Ser: 0.91 mg/dL (ref 0.40–1.20)
GFR: 87.49 mL/min (ref >60.00)
Glucose, Bld: 90 mg/dL (ref 70–99)
Potassium: 4.2 meq/L (ref 3.5–5.1)
Sodium: 139 meq/L (ref 135–145)
Total Bilirubin: 0.3 mg/dL (ref 0.2–1.2)
Total Protein: 7.8 g/dL (ref 6.0–8.3)

## 2023-07-09 LAB — VITAMIN B12: Vitamin B-12: 1123 pg/mL — ABNORMAL HIGH (ref 211–911)

## 2023-07-09 LAB — LIPID PANEL
Cholesterol: 229 mg/dL — ABNORMAL HIGH (ref 0–200)
HDL: 35 mg/dL — ABNORMAL LOW (ref >39.00)
LDL Cholesterol: 142 mg/dL — ABNORMAL HIGH (ref 0–99)
NonHDL: 193.61
Total CHOL/HDL Ratio: 7
Triglycerides: 260 mg/dL — ABNORMAL HIGH (ref 0.0–149.0)
VLDL: 52 mg/dL — ABNORMAL HIGH (ref 0.0–40.0)

## 2023-07-09 LAB — IBC + FERRITIN
Ferritin: 7.5 ng/mL — ABNORMAL LOW (ref 10.0–291.0)
Iron: 74 ug/dL (ref 42–145)
Saturation Ratios: 17.6 % — ABNORMAL LOW (ref 20.0–50.0)
TIBC: 421.4 ug/dL (ref 250.0–450.0)
Transferrin: 301 mg/dL (ref 212.0–360.0)

## 2023-07-09 LAB — VITAMIN D 25 HYDROXY (VIT D DEFICIENCY, FRACTURES): VITD: 10.91 ng/mL — ABNORMAL LOW (ref 30.00–100.00)

## 2023-07-09 NOTE — Assessment & Plan Note (Signed)
 Noted on bilateral hands. Worse during winter months and with frequent hand washing. Improvement noted with moisturizers and over the counter ointments. Continue. Advised to contact if worsening or persisting.

## 2023-07-09 NOTE — Assessment & Plan Note (Addendum)
 Physical exam complete. We will order routine lab work as outlined. She is due for her first Pap smear. She is a smoker and occasional marijuana user. The impact of caffeine on migraines and cardiovascular health was discussed, along with the importance of regular screenings and vaccinations. Perform a Pap smear today. Advise reduction of caffeine intake, particularly Red Bulls, and encourage smoking cessation. Tetanus vaccine is up to date. Continue routine dental and eye exams. Encourage healthy diet and regular exercise. Return to care in one year, sooner as needed.

## 2023-07-09 NOTE — Assessment & Plan Note (Signed)
 She experiences recurrent migraines with increased frequency, inadequately managed with acetaminophen. No clear triggers are identified, though she may be menstrual-related. She prefers to avoid injections. New abortive medications were discussed, and she was advised to monitor migraine frequency for potential preventative treatment. Prescribe abortive medication for use at migraine onset. She should maintain a headache journal to identify potential triggers and report increased frequency for possible preventative treatment. Encourage increased hydration and reduction of caffeine intake, particularly Red Bulls. Return precautions given to patient. If increasing in frequency, worsening or changing in presentation will refer back to Neurology.

## 2023-07-10 ENCOUNTER — Other Ambulatory Visit (HOSPITAL_COMMUNITY): Payer: Self-pay

## 2023-07-10 ENCOUNTER — Other Ambulatory Visit: Payer: Self-pay

## 2023-07-10 ENCOUNTER — Telehealth: Payer: Self-pay

## 2023-07-10 DIAGNOSIS — R899 Unspecified abnormal finding in specimens from other organs, systems and tissues: Secondary | ICD-10-CM

## 2023-07-10 NOTE — Telephone Encounter (Signed)
 Pharmacy Patient Advocate Encounter   Received notification from Onbase that prior authorization for Ubrelvy 100MG  tablets is required/requested.   Insurance verification completed.   The patient is insured through [facility] .   Per test claim: PA required; PA submitted to above mentioned insurance via CoverMyMeds Key/confirmation #/EOC [facility] Status is pending

## 2023-07-10 NOTE — Telephone Encounter (Signed)
 Pharmacy Patient Advocate Encounter  Received notification from [facility] that Prior Authorization for Ubrelvy 100MG  tablets has been DENIED.  See denial reason below. No denial letter attached in CMM. Will attach denial letter to Media tab once received.   PA #/Case ID/Reference #:  [id number]

## 2023-07-11 LAB — CYTOLOGY - PAP
Chlamydia: NEGATIVE
Comment: NEGATIVE
Comment: NEGATIVE
Comment: NEGATIVE
Comment: NORMAL
Diagnosis: NEGATIVE
High risk HPV: NEGATIVE
Neisseria Gonorrhea: NEGATIVE
Trichomonas: NEGATIVE

## 2023-07-14 ENCOUNTER — Other Ambulatory Visit: Payer: Self-pay

## 2023-07-14 ENCOUNTER — Other Ambulatory Visit: Payer: Self-pay | Admitting: Nurse Practitioner

## 2023-07-14 ENCOUNTER — Encounter: Payer: Self-pay | Admitting: Nurse Practitioner

## 2023-07-14 DIAGNOSIS — E559 Vitamin D deficiency, unspecified: Secondary | ICD-10-CM

## 2023-07-14 DIAGNOSIS — E611 Iron deficiency: Secondary | ICD-10-CM

## 2023-07-14 DIAGNOSIS — R899 Unspecified abnormal finding in specimens from other organs, systems and tissues: Secondary | ICD-10-CM

## 2023-07-14 DIAGNOSIS — G43009 Migraine without aura, not intractable, without status migrainosus: Secondary | ICD-10-CM

## 2023-07-14 MED ORDER — IRON (FERROUS SULFATE) 325 (65 FE) MG PO TABS: 1.0000 | ORAL_TABLET | Freq: Every day | ORAL | 1 refills | Status: AC

## 2023-07-14 MED ORDER — SUMATRIPTAN SUCCINATE 50 MG PO TABS: ORAL_TABLET | ORAL | 0 refills | Status: AC

## 2023-07-14 MED ORDER — VITAMIN D (ERGOCALCIFEROL) 1.25 MG (50000 UNIT) PO CAPS: 50000.0000 [IU] | ORAL_CAPSULE | ORAL | 1 refills | Status: AC

## 2023-07-18 ENCOUNTER — Other Ambulatory Visit

## 2023-07-18 DIAGNOSIS — R899 Unspecified abnormal finding in specimens from other organs, systems and tissues: Secondary | ICD-10-CM

## 2023-07-18 LAB — CBC WITH DIFFERENTIAL/PLATELET
Basophils Absolute: 0.1 10*3/uL (ref 0.0–0.1)
Basophils Relative: 0.5 % (ref 0.0–3.0)
Eosinophils Absolute: 0.4 10*3/uL (ref 0.0–0.7)
Eosinophils Relative: 3.7 % (ref 0.0–5.0)
HCT: 42 % (ref 36.0–46.0)
Hemoglobin: 14 g/dL (ref 12.0–15.0)
Lymphocytes Relative: 35.4 % (ref 12.0–46.0)
Lymphs Abs: 3.9 10*3/uL (ref 0.7–4.0)
MCHC: 33.3 g/dL (ref 30.0–36.0)
MCV: 87.1 fl (ref 78.0–100.0)
Monocytes Absolute: 0.7 10*3/uL (ref 0.1–1.0)
Monocytes Relative: 6 % (ref 3.0–12.0)
Neutro Abs: 6 10*3/uL (ref 1.4–7.7)
Neutrophils Relative %: 54.4 % (ref 43.0–77.0)
Platelets: 330 10*3/uL (ref 150.0–400.0)
RBC: 4.82 Mil/uL (ref 3.87–5.11)
RDW: 13.8 % (ref 11.5–15.5)
WBC: 11 10*3/uL — ABNORMAL HIGH (ref 4.0–10.5)

## 2023-08-06 ENCOUNTER — Other Ambulatory Visit (HOSPITAL_COMMUNITY): Payer: Self-pay

## 2024-07-09 ENCOUNTER — Encounter: Admitting: Nurse Practitioner
